# Patient Record
Sex: Female | Born: 1985 | Race: Black or African American | Hispanic: No | Marital: Single | Smoking: Never smoker
Health system: Southern US, Community
[De-identification: ages and names within clinical notes are randomized; demographics above are authoritative.]

## PROBLEM LIST (undated history)

## (undated) DIAGNOSIS — E079 Disorder of thyroid, unspecified: Secondary | ICD-10-CM

---

## 2009-11-01 ENCOUNTER — Emergency Department (HOSPITAL_COMMUNITY): Admission: EM | Admit: 2009-11-01 | Discharge: 2009-11-01 | Payer: Self-pay | Admitting: Emergency Medicine

## 2010-04-20 ENCOUNTER — Emergency Department (HOSPITAL_COMMUNITY): Admission: EM | Admit: 2010-04-20 | Discharge: 2010-04-20 | Payer: Self-pay | Admitting: Family Medicine

## 2010-10-03 LAB — POCT URINALYSIS DIPSTICK
Bilirubin Urine: NEGATIVE
Hgb urine dipstick: NEGATIVE
Ketones, ur: NEGATIVE mg/dL
Nitrite: NEGATIVE
Specific Gravity, Urine: 1.015 (ref 1.005–1.030)
pH: 7 (ref 5.0–8.0)

## 2011-04-08 ENCOUNTER — Inpatient Hospital Stay (INDEPENDENT_AMBULATORY_CARE_PROVIDER_SITE_OTHER)
Admission: RE | Admit: 2011-04-08 | Discharge: 2011-04-08 | Disposition: A | Discharge status: Home / Self Care | Source: Ambulatory Visit | Attending: Family Medicine | Admitting: Family Medicine

## 2011-04-08 DIAGNOSIS — A6000 Herpesviral infection of urogenital system, unspecified: Secondary | ICD-10-CM

## 2011-04-08 DIAGNOSIS — N390 Urinary tract infection, site not specified: Secondary | ICD-10-CM

## 2011-04-08 LAB — POCT URINALYSIS DIP (DEVICE)
Hgb urine dipstick: NEGATIVE
Ketones, ur: NEGATIVE mg/dL
Protein, ur: NEGATIVE mg/dL
Specific Gravity, Urine: 1.01 (ref 1.005–1.030)
pH: 6.5 (ref 5.0–8.0)

## 2011-04-08 LAB — WET PREP, GENITAL

## 2011-04-09 LAB — GC/CHLAMYDIA PROBE AMP, GENITAL
Chlamydia, DNA Probe: NEGATIVE
GC Probe Amp, Genital: NEGATIVE

## 2011-04-10 LAB — HERPES SIMPLEX VIRUS CULTURE

## 2012-02-06 ENCOUNTER — Emergency Department (HOSPITAL_COMMUNITY)

## 2012-02-06 ENCOUNTER — Encounter (HOSPITAL_COMMUNITY): Payer: Self-pay | Admitting: *Deleted

## 2012-02-06 ENCOUNTER — Observation Stay (HOSPITAL_COMMUNITY)
Admission: EM | Admit: 2012-02-06 | Discharge: 2012-02-09 | Disposition: A | Discharge status: Home / Self Care | Attending: Internal Medicine | Admitting: Internal Medicine

## 2012-02-06 DIAGNOSIS — Y998 Other external cause status: Secondary | ICD-10-CM | POA: Insufficient documentation

## 2012-02-06 DIAGNOSIS — IMO0002 Reserved for concepts with insufficient information to code with codable children: Secondary | ICD-10-CM | POA: Insufficient documentation

## 2012-02-06 DIAGNOSIS — R002 Palpitations: Secondary | ICD-10-CM | POA: Insufficient documentation

## 2012-02-06 DIAGNOSIS — S060X0A Concussion without loss of consciousness, initial encounter: Principal | ICD-10-CM | POA: Insufficient documentation

## 2012-02-06 DIAGNOSIS — R000 Tachycardia, unspecified: Secondary | ICD-10-CM | POA: Insufficient documentation

## 2012-02-06 DIAGNOSIS — D649 Anemia, unspecified: Secondary | ICD-10-CM | POA: Insufficient documentation

## 2012-02-06 DIAGNOSIS — R112 Nausea with vomiting, unspecified: Secondary | ICD-10-CM | POA: Insufficient documentation

## 2012-02-06 DIAGNOSIS — E0590 Thyrotoxicosis, unspecified without thyrotoxic crisis or storm: Secondary | ICD-10-CM | POA: Insufficient documentation

## 2012-02-06 DIAGNOSIS — S39012A Strain of muscle, fascia and tendon of lower back, initial encounter: Secondary | ICD-10-CM

## 2012-02-06 DIAGNOSIS — R55 Syncope and collapse: Secondary | ICD-10-CM | POA: Insufficient documentation

## 2012-02-06 LAB — PROTIME-INR
INR: 1.27 (ref 0.00–1.49)
Prothrombin Time: 16.2 seconds — ABNORMAL HIGH (ref 11.6–15.2)

## 2012-02-06 LAB — COMPREHENSIVE METABOLIC PANEL
AST: 33 U/L (ref 0–37)
BUN: 11 mg/dL (ref 6–23)
CO2: 23 mEq/L (ref 19–32)
Calcium: 9.2 mg/dL (ref 8.4–10.5)
Creatinine, Ser: 0.52 mg/dL (ref 0.50–1.10)
GFR calc non Af Amer: >90 mL/min (ref >90)

## 2012-02-06 LAB — CBC WITH DIFFERENTIAL/PLATELET
Basophils Absolute: 0 10*3/uL (ref 0.0–0.1)
Basophils Relative: 0 % (ref 0–1)
Eosinophils Relative: 1 % (ref 0–5)
HCT: 31.3 % — ABNORMAL LOW (ref 36.0–46.0)
Hemoglobin: 10.6 g/dL — ABNORMAL LOW (ref 12.0–15.0)
Lymphocytes Relative: 39 % (ref 12–46)
MCHC: 33.9 g/dL (ref 30.0–36.0)
MCV: 78.1 fL (ref 78.0–100.0)
Monocytes Absolute: 0.7 10*3/uL (ref 0.1–1.0)
Monocytes Relative: 18 % — ABNORMAL HIGH (ref 3–12)
RDW: 13.8 % (ref 11.5–15.5)

## 2012-02-06 LAB — APTT: aPTT: 34 seconds (ref 24–37)

## 2012-02-06 MED ORDER — SODIUM CHLORIDE 0.9 % IV BOLUS (SEPSIS)
1000.0000 mL | Freq: Once | INTRAVENOUS | Status: AC
  Administered 2012-02-06: 1000 mL via INTRAVENOUS

## 2012-02-06 MED ORDER — IBUPROFEN 600 MG PO TABS: 600.0000 mg | ORAL_TABLET | Freq: Four times a day (QID) | ORAL | Status: DC | PRN

## 2012-02-06 MED ORDER — MORPHINE SULFATE 4 MG/ML IJ SOLN
4.0000 mg | Freq: Once | INTRAMUSCULAR | Status: AC
  Administered 2012-02-06: 4 mg via INTRAVENOUS
  Filled 2012-02-06: qty 1

## 2012-02-06 MED ORDER — ONDANSETRON HCL 4 MG/2ML IJ SOLN
4.0000 mg | Freq: Once | INTRAMUSCULAR | Status: AC
  Administered 2012-02-06: 4 mg via INTRAVENOUS
  Filled 2012-02-06: qty 2

## 2012-02-06 MED ORDER — METHOCARBAMOL 500 MG PO TABS: 500.0000 mg | ORAL_TABLET | Freq: Two times a day (BID) | ORAL | Status: DC

## 2012-02-06 MED ORDER — HYDROCODONE-ACETAMINOPHEN 5-325 MG PO TABS: 2.0000 | ORAL_TABLET | ORAL | Status: DC | PRN
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
## 2012-02-07 ENCOUNTER — Encounter (HOSPITAL_COMMUNITY): Payer: Self-pay | Admitting: Internal Medicine

## 2012-02-07 ENCOUNTER — Emergency Department (HOSPITAL_COMMUNITY)

## 2012-02-07 ENCOUNTER — Observation Stay (HOSPITAL_COMMUNITY)

## 2012-02-07 DIAGNOSIS — R000 Tachycardia, unspecified: Secondary | ICD-10-CM

## 2012-02-07 DIAGNOSIS — D649 Anemia, unspecified: Secondary | ICD-10-CM | POA: Diagnosis present

## 2012-02-07 DIAGNOSIS — R55 Syncope and collapse: Secondary | ICD-10-CM

## 2012-02-07 DIAGNOSIS — R112 Nausea with vomiting, unspecified: Secondary | ICD-10-CM

## 2012-02-07 DIAGNOSIS — R002 Palpitations: Secondary | ICD-10-CM

## 2012-02-07 LAB — URINE MICROSCOPIC-ADD ON

## 2012-02-07 LAB — URINALYSIS, ROUTINE W REFLEX MICROSCOPIC
Bilirubin Urine: NEGATIVE
Glucose, UA: NEGATIVE mg/dL
Ketones, ur: NEGATIVE mg/dL
Protein, ur: NEGATIVE mg/dL
Urobilinogen, UA: 0.2 mg/dL (ref 0.0–1.0)

## 2012-02-07 LAB — CARDIAC PANEL(CRET KIN+CKTOT+MB+TROPI)
Relative Index: 2.3 (ref 0.0–2.5)
Troponin I: <0.3 ng/mL (ref <0.30)

## 2012-02-07 MED ORDER — ONDANSETRON HCL 4 MG/2ML IJ SOLN
4.0000 mg | Freq: Once | INTRAMUSCULAR | Status: AC
  Administered 2012-02-07: 4 mg via INTRAVENOUS
  Filled 2012-02-07: qty 2

## 2012-02-07 MED ORDER — SODIUM CHLORIDE 0.9 % IV SOLN
INTRAVENOUS | Status: DC
  Administered 2012-02-07 (×2): via INTRAVENOUS

## 2012-02-07 MED ORDER — ACETAMINOPHEN 650 MG RE SUPP: 650.0000 mg | Freq: Four times a day (QID) | RECTAL | Status: DC | PRN

## 2012-02-07 MED ORDER — MORPHINE SULFATE 2 MG/ML IJ SOLN: 2.0000 mg | INTRAMUSCULAR | Status: DC | PRN

## 2012-02-07 MED ORDER — DEXTROSE-NACL 5-0.9 % IV SOLN: INTRAVENOUS | Status: DC

## 2012-02-07 MED ORDER — ONDANSETRON HCL 4 MG PO TABS: 4.0000 mg | ORAL_TABLET | Freq: Four times a day (QID) | ORAL | Status: DC | PRN

## 2012-02-07 MED ORDER — METOPROLOL TARTRATE 12.5 MG HALF TABLET
12.5000 mg | ORAL_TABLET | Freq: Two times a day (BID) | ORAL | Status: DC
  Administered 2012-02-07 – 2012-02-09 (×3): 12.5 mg via ORAL
  Filled 2012-02-07 (×5): qty 1

## 2012-02-07 MED ORDER — IOHEXOL 300 MG/ML  SOLN
100.0000 mL | Freq: Once | INTRAMUSCULAR | Status: AC | PRN
  Administered 2012-02-07: 100 mL via INTRAVENOUS

## 2012-02-07 MED ORDER — ONDANSETRON HCL 4 MG/2ML IJ SOLN
4.0000 mg | Freq: Four times a day (QID) | INTRAMUSCULAR | Status: DC | PRN
  Administered 2012-02-07: 4 mg via INTRAVENOUS
  Filled 2012-02-07: qty 2

## 2012-02-07 MED ORDER — LORAZEPAM 2 MG/ML IJ SOLN
1.0000 mg | Freq: Once | INTRAMUSCULAR | Status: AC
  Administered 2012-02-07: 1 mg via INTRAVENOUS
  Filled 2012-02-07: qty 1

## 2012-02-07 MED ORDER — ACETAMINOPHEN 325 MG PO TABS: 650.0000 mg | ORAL_TABLET | Freq: Four times a day (QID) | ORAL | Status: DC | PRN

## 2012-02-07 MED ORDER — IOHEXOL 350 MG/ML SOLN
100.0000 mL | Freq: Once | INTRAVENOUS | Status: AC | PRN
  Administered 2012-02-07: 100 mL via INTRAVENOUS
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
## 2012-02-07 NOTE — ED Notes (Addendum)
[phone] (Home) - Father, Langston Masker

## 2012-02-07 NOTE — ED Notes (Signed)
Patient transported to Ultrasound 
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
## 2012-02-08 ENCOUNTER — Encounter (HOSPITAL_COMMUNITY): Payer: Self-pay | Admitting: *Deleted

## 2012-02-08 DIAGNOSIS — R000 Tachycardia, unspecified: Secondary | ICD-10-CM

## 2012-02-08 DIAGNOSIS — R55 Syncope and collapse: Secondary | ICD-10-CM

## 2012-02-08 DIAGNOSIS — I059 Rheumatic mitral valve disease, unspecified: Secondary | ICD-10-CM

## 2012-02-08 LAB — T3
T3, Total: 351.1 ng/dl — ABNORMAL HIGH (ref 80.0–204.0)
T3, Total: 316.9 ng/dl — ABNORMAL HIGH (ref 80.0–204.0)

## 2012-02-08 LAB — BASIC METABOLIC PANEL
CO2: 25 mEq/L (ref 19–32)
Calcium: 9.1 mg/dL (ref 8.4–10.5)
GFR calc Af Amer: >90 mL/min (ref >90)
GFR calc non Af Amer: >90 mL/min (ref >90)
Sodium: 142 mEq/L (ref 135–145)

## 2012-02-08 LAB — CBC
MCV: 79.4 fL (ref 78.0–100.0)
Platelets: 161 10*3/uL (ref 150–400)
RBC: 3.74 MIL/uL — ABNORMAL LOW (ref 3.87–5.11)
WBC: 2.6 10*3/uL — ABNORMAL LOW (ref 4.0–10.5)

## 2012-02-08 LAB — T4, FREE
Free T4: 4.34 ng/dL — ABNORMAL HIGH (ref 0.80–1.80)
Free T4: 4.38 ng/dL — ABNORMAL HIGH (ref 0.80–1.80)

## 2012-02-08 MED ORDER — METHIMAZOLE 10 MG PO TABS
10.0000 mg | ORAL_TABLET | Freq: Two times a day (BID) | ORAL | Status: DC
  Administered 2012-02-08 – 2012-02-09 (×2): 10 mg via ORAL
  Filled 2012-02-08 (×5): qty 1

## 2012-02-08 MED ORDER — POTASSIUM CHLORIDE CRYS ER 20 MEQ PO TBCR
40.0000 meq | EXTENDED_RELEASE_TABLET | Freq: Once | ORAL | Status: AC
  Administered 2012-02-08: 40 meq via ORAL
  Filled 2012-02-08: qty 2
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
## 2012-02-08 NOTE — Progress Notes (Signed)
  Echocardiogram 2D Echocardiogram has been performed.  [place] 02/08/2012, 12:58 PM

## 2012-02-09 DIAGNOSIS — E0590 Thyrotoxicosis, unspecified without thyrotoxic crisis or storm: Secondary | ICD-10-CM | POA: Diagnosis present

## 2012-02-09 DIAGNOSIS — R222 Localized swelling, mass and lump, trunk: Secondary | ICD-10-CM

## 2012-02-09 DIAGNOSIS — S060X0A Concussion without loss of consciousness, initial encounter: Principal | ICD-10-CM

## 2012-02-09 DIAGNOSIS — IMO0002 Reserved for concepts with insufficient information to code with codable children: Secondary | ICD-10-CM

## 2012-02-09 LAB — FERRITIN: Ferritin: 67 ng/mL (ref 10–291)

## 2012-02-09 LAB — RETICULOCYTES
RBC.: 3.99 MIL/uL (ref 3.87–5.11)
Retic Count, Absolute: 47.9 10*3/uL (ref 19.0–186.0)
Retic Ct Pct: 1.2 % (ref 0.4–3.1)

## 2012-02-09 LAB — T3 UPTAKE: T3 Uptake Ratio: 53.1 % — ABNORMAL HIGH (ref 22.5–37.0)

## 2012-02-09 MED ORDER — METHOCARBAMOL 500 MG PO TABS: 500.0000 mg | ORAL_TABLET | Freq: Three times a day (TID) | ORAL | Status: AC | PRN

## 2012-02-09 MED ORDER — METOPROLOL TARTRATE 12.5 MG HALF TABLET: 12.5000 mg | ORAL_TABLET | Freq: Two times a day (BID) | ORAL | Status: DC

## 2012-02-09 MED ORDER — PROMETHAZINE HCL 12.5 MG PO TABS: 12.5000 mg | ORAL_TABLET | Freq: Four times a day (QID) | ORAL | Status: DC | PRN

## 2012-02-09 MED ORDER — METHIMAZOLE 10 MG PO TABS: 10.0000 mg | ORAL_TABLET | Freq: Two times a day (BID) | ORAL | Status: DC
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
## 2012-02-09 NOTE — Progress Notes (Signed)
Patient discharged to home.  Discharge teaching completed including follow up appointments, and discharge meds. Verbalizes understanding with no further questions.

## 2012-02-10 LAB — URINE DRUGS OF ABUSE SCREEN W ALC, ROUTINE (REF LAB)
Amphetamine Screen, Ur: NEGATIVE
Barbiturate Quant, Ur: NEGATIVE
Benzodiazepines.: NEGATIVE
Creatinine,U: 50.2 mg/dL
Ethyl Alcohol: <10 mg/dL (ref <10)
Marijuana Metabolite: NEGATIVE
Opiate Screen, Urine: NEGATIVE

## 2012-04-17 ENCOUNTER — Emergency Department (HOSPITAL_COMMUNITY)
Admission: EM | Admit: 2012-04-17 | Discharge: 2012-04-17 | Disposition: A | Discharge status: Home / Self Care | Attending: Emergency Medicine | Admitting: Emergency Medicine

## 2012-04-17 ENCOUNTER — Encounter (HOSPITAL_COMMUNITY): Payer: Self-pay | Admitting: Emergency Medicine

## 2012-04-17 ENCOUNTER — Emergency Department (HOSPITAL_COMMUNITY)

## 2012-04-17 DIAGNOSIS — S61059A Open bite of unspecified thumb without damage to nail, initial encounter: Secondary | ICD-10-CM

## 2012-04-17 DIAGNOSIS — W540XXA Bitten by dog, initial encounter: Secondary | ICD-10-CM | POA: Insufficient documentation

## 2012-04-17 DIAGNOSIS — S61209A Unspecified open wound of unspecified finger without damage to nail, initial encounter: Secondary | ICD-10-CM | POA: Insufficient documentation

## 2012-04-17 DIAGNOSIS — Z91013 Allergy to seafood: Secondary | ICD-10-CM | POA: Insufficient documentation

## 2012-04-17 HISTORY — DX: Disorder of thyroid, unspecified: E07.9

## 2012-04-17 MED ORDER — TRAMADOL HCL 50 MG PO TABS
100.0000 mg | ORAL_TABLET | Freq: Once | ORAL | Status: AC
  Administered 2012-04-17: 100 mg via ORAL
  Filled 2012-04-17: qty 2

## 2012-04-17 MED ORDER — AMOXICILLIN-POT CLAVULANATE 875-125 MG PO TABS: 1.0000 | ORAL_TABLET | Freq: Two times a day (BID) | ORAL | Status: DC

## 2012-04-17 MED ORDER — TRAMADOL-ACETAMINOPHEN 37.5-325 MG PO TABS: ORAL_TABLET | ORAL | Status: DC

## 2012-04-17 MED ORDER — ACETAMINOPHEN 325 MG PO TABS
650.0000 mg | ORAL_TABLET | Freq: Once | ORAL | Status: AC
  Administered 2012-04-17: 650 mg via ORAL
  Filled 2012-04-17: qty 2

## 2012-04-17 NOTE — ED Notes (Signed)
Pt reports that her dog bit her yesterday morning on her left thumb. Bleeding controlled. Pt reports dog is up to date on all shots.
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
## 2012-04-17 NOTE — ED Notes (Signed)
Patient has noted cracked nail as well.  She is attempting to remove the polish for better exam

## 2012-07-13 ENCOUNTER — Other Ambulatory Visit: Payer: Self-pay | Admitting: *Deleted

## 2012-07-13 DIAGNOSIS — J9859 Other diseases of mediastinum, not elsewhere classified: Secondary | ICD-10-CM

## 2012-08-10 ENCOUNTER — Encounter: Admitting: Thoracic Surgery (Cardiothoracic Vascular Surgery)

## 2012-08-10 ENCOUNTER — Other Ambulatory Visit

## 2012-08-31 ENCOUNTER — Inpatient Hospital Stay: Admission: RE | Admit: 2012-08-31 | Source: Ambulatory Visit

## 2012-08-31 ENCOUNTER — Encounter: Admitting: Thoracic Surgery (Cardiothoracic Vascular Surgery)

## 2013-05-09 ENCOUNTER — Other Ambulatory Visit (HOSPITAL_COMMUNITY)
Admission: RE | Admit: 2013-05-09 | Discharge: 2013-05-09 | Disposition: A | Discharge status: Home / Self Care | Source: Ambulatory Visit | Attending: Family Medicine | Admitting: Family Medicine

## 2013-05-09 ENCOUNTER — Emergency Department (HOSPITAL_COMMUNITY)
Admission: EM | Admit: 2013-05-09 | Discharge: 2013-05-09 | Disposition: A | Discharge status: Home / Self Care | Source: Home / Self Care | Attending: Family Medicine | Admitting: Family Medicine

## 2013-05-09 ENCOUNTER — Encounter (HOSPITAL_COMMUNITY): Payer: Self-pay | Admitting: Emergency Medicine

## 2013-05-09 DIAGNOSIS — N760 Acute vaginitis: Secondary | ICD-10-CM | POA: Insufficient documentation

## 2013-05-09 DIAGNOSIS — N72 Inflammatory disease of cervix uteri: Secondary | ICD-10-CM

## 2013-05-09 DIAGNOSIS — Z113 Encounter for screening for infections with a predominantly sexual mode of transmission: Secondary | ICD-10-CM | POA: Insufficient documentation

## 2013-05-09 LAB — POCT URINALYSIS DIP (DEVICE)
Bilirubin Urine: NEGATIVE
Ketones, ur: NEGATIVE mg/dL
Leukocytes, UA: NEGATIVE
Protein, ur: NEGATIVE mg/dL

## 2013-05-09 MED ORDER — LIDOCAINE HCL (PF) 1 % IJ SOLN
INTRAMUSCULAR | Status: AC
  Filled 2013-05-09: qty 5

## 2013-05-09 MED ORDER — CEFTRIAXONE SODIUM 250 MG IJ SOLR
INTRAMUSCULAR | Status: AC
  Filled 2013-05-09: qty 250

## 2013-05-09 MED ORDER — CEFTRIAXONE SODIUM 250 MG IJ SOLR
250.0000 mg | Freq: Once | INTRAMUSCULAR | Status: AC
  Administered 2013-05-09: 250 mg via INTRAMUSCULAR

## 2013-05-09 MED ORDER — AZITHROMYCIN 250 MG PO TABS
1000.0000 mg | ORAL_TABLET | Freq: Once | ORAL | Status: AC
  Administered 2013-05-09: 1000 mg via ORAL

## 2013-05-09 MED ORDER — FLUCONAZOLE 150 MG PO TABS: 150.0000 mg | ORAL_TABLET | Freq: Once | ORAL | Status: DC

## 2013-05-09 MED ORDER — AZITHROMYCIN 250 MG PO TABS
ORAL_TABLET | ORAL | Status: AC
  Filled 2013-05-09: qty 4

## 2013-05-09 NOTE — ED Notes (Signed)
Concern for STD; had similar syx of vaginal dc when she had chlamydia; partner has NOT informed her of issues

## 2013-05-09 NOTE — ED Notes (Signed)
Call back number for lab issues verified 

## 2013-05-09 NOTE — ED Provider Notes (Signed)
CSN: [id number]     Arrival date & time 05/09/13  1635 History   First MD Initiated Contact with Patient 05/09/13 1852     No chief complaint on file.  (Consider location/radiation/quality/duration/timing/severity/associated sxs/prior Treatment) Patient is a 27 y.o. female presenting with vaginal discharge. The history is provided by the patient. No language interpreter was used.  Vaginal Discharge Quality:  Watery Severity:  Moderate Onset quality:  Gradual Timing:  Constant Progression:  Worsening Chronicity:  New Relieved by:  Nothing Worsened by:  Nothing tried Ineffective treatments:  None tried   Past Medical History  Diagnosis Date  . Thyroid disease    Past Surgical History  Procedure Laterality Date  . Cesarean section     No family history on file. History  Substance Use Topics  . Smoking status: Never Smoker   . Smokeless tobacco: Not on file  . Alcohol Use: Yes   OB History   Grav Para Term Preterm Abortions TAB SAB Ect Mult Living                 Review of Systems  Genitourinary: Positive for vaginal discharge.  All other systems reviewed and are negative.    Allergies  Shellfish allergy  Home Medications   Current Outpatient Rx  Name  Route  Sig  Dispense  Refill  . amoxicillin-clavulanate (AUGMENTIN) 875-125 MG per tablet   Oral   Take 1 tablet by mouth every 12 (twelve) hours.   14 tablet   0   . EXPIRED: promethazine (PHENERGAN) 12.5 MG tablet   Oral   Take 1 tablet (12.5 mg total) by mouth every 6 (six) hours as needed for nausea.   30 tablet   0   . traMADol-acetaminophen (ULTRACET) 37.5-325 MG per tablet      2 tabs po QID prn pain   16 tablet   0    There were no vitals taken for this visit. Physical Exam  Nursing note and vitals reviewed. Constitutional: She is oriented to person, place, and time. She appears well-developed and well-nourished.  HENT:  Head: Normocephalic and atraumatic.  Eyes: Conjunctivae and EOM are  normal. Pupils are equal, round, and reactive to light.  Neck: Normal range of motion.  Cardiovascular: Normal rate and normal heart sounds.   Pulmonary/Chest: Effort normal and breath sounds normal.  Abdominal: Soft.  Genitourinary: Vaginal discharge found.  Neurological: She is alert and oriented to person, place, and time. She has normal reflexes.  Skin: Skin is warm.  Psychiatric: She has a normal mood and affect.    ED Course  Procedures (including critical care time) Labs Review Labs Reviewed - No data to display Imaging Review No results found.  EKG Interpretation     Ventricular Rate:    PR Interval:    QRS Duration:   QT Interval:    QTC Calculation:   R Axis:     Text Interpretation:              MDM   1. Cervicitis    Pt given rocephin, zithromax and diflucan.      Elson Areas, PA-C 05/09/13 [phone]

## 2013-05-11 NOTE — ED Provider Notes (Signed)
Medical screening examination/treatment/procedure(s) were performed by resident physician or non-physician practitioner and as supervising physician I was immediately available for consultation/collaboration.   KINDL,JAMES DOUGLAS MD.   James D Kindl, MD 05/11/13 2101 

## 2013-05-12 ENCOUNTER — Telehealth (HOSPITAL_COMMUNITY): Payer: Self-pay | Admitting: *Deleted

## 2013-05-12 MED ORDER — METRONIDAZOLE 500 MG PO TABS: 500.0000 mg | ORAL_TABLET | Freq: Two times a day (BID) | ORAL | Status: DC

## 2013-05-12 NOTE — ED Notes (Addendum)
10/22  GC/Chlamydia neg. Affirm: Candida and Trich neg., Gardnerella pos. Langston Masker PA notified and e-prescribed Flagyl to pt.'s pharmacy.  10/23   I called pt. Pt. verified x 2 and given results.  Pt. told she needs Flagyl for bacterial vaginosis and where to pick up her Rx.   Pt. instructed to no alcohol while taking this medication.  Pt. voiced understanding. [name] 05/12/2013

## 2013-08-01 ENCOUNTER — Other Ambulatory Visit (HOSPITAL_COMMUNITY)
Admission: RE | Admit: 2013-08-01 | Discharge: 2013-08-01 | Disposition: A | Discharge status: Home / Self Care | Source: Ambulatory Visit | Attending: Emergency Medicine | Admitting: Emergency Medicine

## 2013-08-01 ENCOUNTER — Encounter (HOSPITAL_COMMUNITY): Payer: Self-pay | Admitting: Emergency Medicine

## 2013-08-01 ENCOUNTER — Emergency Department (INDEPENDENT_AMBULATORY_CARE_PROVIDER_SITE_OTHER)
Admission: EM | Admit: 2013-08-01 | Discharge: 2013-08-01 | Disposition: A | Discharge status: Home / Self Care | Source: Home / Self Care | Attending: Emergency Medicine | Admitting: Emergency Medicine

## 2013-08-01 DIAGNOSIS — Z113 Encounter for screening for infections with a predominantly sexual mode of transmission: Secondary | ICD-10-CM | POA: Insufficient documentation

## 2013-08-01 DIAGNOSIS — N760 Acute vaginitis: Secondary | ICD-10-CM | POA: Insufficient documentation

## 2013-08-01 DIAGNOSIS — N898 Other specified noninflammatory disorders of vagina: Secondary | ICD-10-CM

## 2013-08-01 LAB — POCT URINALYSIS DIP (DEVICE)
Bilirubin Urine: NEGATIVE
GLUCOSE, UA: NEGATIVE mg/dL
Ketones, ur: NEGATIVE mg/dL
Leukocytes, UA: NEGATIVE
NITRITE: NEGATIVE
Protein, ur: NEGATIVE mg/dL
Specific Gravity, Urine: 1.025 (ref 1.005–1.030)
Urobilinogen, UA: 0.2 mg/dL (ref 0.0–1.0)
pH: 7 (ref 5.0–8.0)

## 2013-08-01 LAB — POCT PREGNANCY, URINE: Preg Test, Ur: NEGATIVE

## 2013-08-01 NOTE — Discharge Instructions (Signed)
You will be contacted with the results of your swabs and treatment will be based upon the results.

## 2013-08-01 NOTE — ED Notes (Signed)
C/o vaginal discharge with odor.  Mild pelvic pain.  Denies fever and any other symptoms.  Onset 1/9.  No otc meds used.

## 2013-08-01 NOTE — ED Provider Notes (Signed)
Medical screening examination/treatment/procedure(s) were performed by non-physician practitioner and as supervising physician I was immediately available for consultation/collaboration.  Ladasha Schnackenberg, M.D.  Latoyia Tecson C London Tarnowski, MD 08/01/13 2319 

## 2013-08-01 NOTE — ED Provider Notes (Signed)
CSN: [id number]     Arrival date & time 08/01/13  [id number] History   First MD Initiated Contact with Patient 08/01/13 [phone]     Chief Complaint  Patient presents with  . Vaginal Discharge   (Consider location/radiation/quality/duration/timing/severity/associated sxs/prior Treatment) HPI Comments: LNMP 07/14/2013  Patient is a 28 y.o. female presenting with vaginal discharge. The history is provided by the patient.  Vaginal Discharge Quality:  Malodorous Severity:  Moderate Onset quality:  Gradual Duration:  4 days Progression:  Unchanged Relieved by:  None tried Associated symptoms: no abdominal pain, no dyspareunia, no dysuria, no fever, no genital lesions, no nausea, no rash, no urinary frequency, no vaginal itching and no vomiting     Past Medical History  Diagnosis Date  . Thyroid disease    Past Surgical History  Procedure Laterality Date  . Cesarean section     History reviewed. No pertinent family history. History  Substance Use Topics  . Smoking status: Never Smoker   . Smokeless tobacco: Not on file  . Alcohol Use: Yes   OB History   Grav Para Term Preterm Abortions TAB SAB Ect Mult Living                 Review of Systems  Constitutional: Negative for fever.  Gastrointestinal: Negative for nausea, vomiting and abdominal pain.  Genitourinary: Positive for vaginal discharge. Negative for dysuria and dyspareunia.  All other systems reviewed and are negative.    Allergies  Shellfish allergy  Home Medications   Current Outpatient Rx  Name  Route  Sig  Dispense  Refill  . amoxicillin-clavulanate (AUGMENTIN) 875-125 MG per tablet   Oral   Take 1 tablet by mouth every 12 (twelve) hours.   14 tablet   0   . fluconazole (DIFLUCAN) 150 MG tablet   Oral   Take 1 tablet (150 mg total) by mouth once.   1 tablet   1   . metroNIDAZOLE (FLAGYL) 500 MG tablet   Oral   Take 1 tablet (500 mg total) by mouth 2 (two) times daily.   14 tablet   0   . EXPIRED:  promethazine (PHENERGAN) 12.5 MG tablet   Oral   Take 1 tablet (12.5 mg total) by mouth every 6 (six) hours as needed for nausea.   30 tablet   0   . traMADol-acetaminophen (ULTRACET) 37.5-325 MG per tablet      2 tabs po QID prn pain   16 tablet   0    BP 130/85  Pulse 112  Temp(Src) 99.8 F (37.7 C) (Oral)  Resp 20  SpO2 100%  LMP 07/14/2013 Physical Exam  Nursing note and vitals reviewed. Constitutional: She is oriented to person, place, and time. She appears well-developed and well-nourished. No distress.  Eyes: Conjunctivae are normal.  Neck: Normal range of motion.  Cardiovascular: Normal rate.   Pulmonary/Chest: Effort normal.  Abdominal: Soft. Bowel sounds are normal. She exhibits no distension. There is no tenderness. Hernia confirmed negative in the right inguinal area and confirmed negative in the left inguinal area.  Genitourinary: Uterus normal. Pelvic exam was performed with patient supine. There is no rash, tenderness or lesion on the right labia. There is no rash, tenderness or lesion on the left labia. Cervix exhibits no motion tenderness, no discharge and no friability. Right adnexum displays no mass, no tenderness and no fullness. Left adnexum displays no mass, no tenderness and no fullness. No erythema, tenderness or bleeding around the vagina. No foreign  body around the vagina. No signs of injury around the vagina. No vaginal discharge found.  Neurological: She is alert and oriented to person, place, and time.  Skin: Skin is warm and dry. No rash noted.  Psychiatric: She has a normal mood and affect. Her behavior is normal.    ED Course  Procedures (including critical care time) Labs Review Labs Reviewed  CERVICOVAGINAL ANCILLARY ONLY   Imaging Review No results found.  EKG Interpretation    Date/Time:    Ventricular Rate:    PR Interval:    QRS Duration:   QT Interval:    QTC Calculation:   R Axis:     Text Interpretation:               MDM  28 y/o F with hx of BV 2-3 months ago here with concerns that she has developed BV again. Counseled patient that she may wish to re-establish care with her previous OB/GYN provider not only if vaginal discharge becomes a recurrent issue, but for annual GYN health maintenance visits as well. Treatment to be based upon results of pelvic swabs. Exam unremarkable.     Jess Barters [place], [place] 08/01/13 [phone]

## 2013-08-03 ENCOUNTER — Telehealth (HOSPITAL_COMMUNITY): Payer: Self-pay | Admitting: Physician Assistant

## 2013-08-03 MED ORDER — METRONIDAZOLE 500 MG PO TABS: 500.0000 mg | ORAL_TABLET | Freq: Two times a day (BID) | ORAL | Status: DC

## 2013-08-03 NOTE — Telephone Encounter (Signed)
Patient stated at time of visit if swab consistent with BV, she would like to be treated. Results reviewed and consistent with BV. Will E-prescribe metronidazole 500 mg po BID x 7 days and advise GYN follow up if no improvement.

## 2013-08-04 ENCOUNTER — Telehealth (HOSPITAL_COMMUNITY): Payer: Self-pay | Admitting: *Deleted

## 2013-08-04 NOTE — ED Notes (Signed)
GC/Chlamydia neg., Affirm: Candida and Trich neg., Gardnerella pos. 1/14 Narda BondsLee Presson PA notified and she e-prescribed Flagyl.  1/15  I called pt. Pt. verified x 2 and given results.  Pt. told she needs Flagyl for bacterial vaginosis.   Pt. instructed to no alcohol while taking this medication. Pt. told where to pick up her Rx. Pt. voiced understanding. Vassie MoselleYork, Daden Mahany M 08/04/2013

## 2013-10-10 ENCOUNTER — Emergency Department (INDEPENDENT_AMBULATORY_CARE_PROVIDER_SITE_OTHER)
Admission: EM | Admit: 2013-10-10 | Discharge: 2013-10-10 | Disposition: A | Discharge status: Home / Self Care | Source: Home / Self Care | Attending: Family Medicine | Admitting: Family Medicine

## 2013-10-10 ENCOUNTER — Encounter (HOSPITAL_COMMUNITY): Payer: Self-pay | Admitting: Emergency Medicine

## 2013-10-10 DIAGNOSIS — N63 Unspecified lump in unspecified breast: Secondary | ICD-10-CM

## 2013-10-10 NOTE — ED Provider Notes (Signed)
Peggy Doyle is a 28 y.o. female who presents to Urgent Care today for right breast mass. Present in the 8:00 position of her right breast for the last 2 days. It is somewhat tender and mobile. No nipple discharge. No fevers chills nausea vomiting or diarrhea. Patient has a history of fibrocystic change of the right breast when she was 28 years old. No significant family history for breast cancer. Last menstrual period ended March 19.   Past Medical History  Diagnosis Date  . Thyroid disease    History  Substance Use Topics  . Smoking status: Never Smoker   . Smokeless tobacco: Not on file  . Alcohol Use: Yes   ROS as above Medications: No current facility-administered medications for this encounter.   Current Outpatient Prescriptions  Medication Sig Dispense Refill  . amoxicillin-clavulanate (AUGMENTIN) 875-125 MG per tablet Take 1 tablet by mouth every 12 (twelve) hours.  14 tablet  0  . fluconazole (DIFLUCAN) 150 MG tablet Take 1 tablet (150 mg total) by mouth once.  1 tablet  1  . metroNIDAZOLE (FLAGYL) 500 MG tablet Take 1 tablet (500 mg total) by mouth 2 (two) times daily.  14 tablet  0  . metroNIDAZOLE (FLAGYL) 500 MG tablet Take 1 tablet (500 mg total) by mouth 2 (two) times daily. X 7 days  14 tablet  0  . promethazine (PHENERGAN) 12.5 MG tablet Take 1 tablet (12.5 mg total) by mouth every 6 (six) hours as needed for nausea.  30 tablet  0  . traMADol-acetaminophen (ULTRACET) 37.5-325 MG per tablet 2 tabs po QID prn pain  16 tablet  0    Exam:  BP 128/85  Pulse 78  Temp(Src) 98.9 F (37.2 C) (Oral)  Resp 16  SpO2 98%  LMP 10/01/2013 Gen: Well NAD BREASTS:  Right breast: Pierced nipple  otherwise normal-appearing. No skin changes. Small 1 cm mildly tender mobile mass in the 8:00 position consistent with fibrocystic change.  Left breast: Pierced nipple  normal-appearing no masses palpated nontender  Assessment and Plan: [age] y.o. female with right breast mass. Likely  fibrocystic change. Diagnostic mammogram and ultrasound ordered. Followup with primary care provider  Discussed warning signs or symptoms. Please see discharge instructions. Patient expresses understanding.    Rodolph BongEvan S Adonis Yim, MD 10/10/13 [phone]

## 2013-10-10 NOTE — ED Notes (Signed)
2 day history of pain in right breast. Denies trauma. States she had something similar many years ago, and had a mammogram that showed some type of a fibrous cyst. LMP ended 3-19, NAD

## 2013-10-10 NOTE — Discharge Instructions (Signed)
Thank you for coming in today. [facility] should call you soon. Call [phone] if you do not hear from them.  If you have trouble scheduling call my clinic and we will fix it.  Come back as needed.  Try ibuprofen for symptomatic relief.   Fibrocystic Breast Changes Fibrocystic breast changes occur when breast ducts become blocked, causing painful, fluid-filled lumps (cysts) to form in the breast. This is a common condition that is noncancerous (benign). It occurs when women go through hormonal changes during their menstrual cycle. Fibrocystic breast changes can affect one or both breasts. CAUSES  The exact cause of fibrocystic breast changes is not known, but it may be related to the female hormones, estrogen and progesterone. Family traits that get passed from parent to child (genetics) may also be a factor in some cases. SIGNS AND SYMPTOMS   Tenderness, mild discomfort, or pain.   Swelling.   Ropelike feeling when touching the breast.   Lumpy breast, one or both sides.   Changes in breast size, especially before (larger) and after (smaller) the menstrual period.   Green or dark brown nipple discharge (not blood).  Symptoms are usually worse before menstrual periods start and get better toward the end of the menstrual period.  DIAGNOSIS  To make a diagnosis, your health care provider will ask you questions and perform a physical exam of your breasts. The health care provider may recommend other tests that can examine inside your breasts, such as:  A breast X-ray (mammogram).   Ultrasonography.  An MRI.  If something more than fibrocystic breast changes is suspected, your health care provider may take a breast tissue sample (breast biopsy) to examine. TREATMENT  Often, treatment is not needed. Your health care provider may recommend over-the-counter pain relievers to help lessen pain or discomfort caused by the fibrocystic breast changes. You may  also be asked to change your diet to limit or stop eating foods or drinking beverages that contain caffeine. Foods and beverages that contain caffeine include chocolate, soda, coffee, and tea. Reducing sugar and fat in your diet may also help. Your health care provider may also recommend:  Fine needle aspiration to remove fluid from a cyst that is causing pain.   Surgery to remove a large, persistent, and tender cyst. HOME CARE INSTRUCTIONS   Examine your breasts after every menstrual period. If you do not have menstrual periods, check your breasts the first day of every month. Feel for changes, such as more tenderness, a new growth, a change in breast size, or a change in a lump that has always been there.   Only take over-the-counter or prescription medicine as directed by your health care provider.   Wear a well-fitted support or sports bra, especially when exercising.   Decrease or avoid caffeine, fat, and sugar in your diet as directed by your health care provider.  SEEK MEDICAL CARE IF:   You have fluid leaking (discharge) from your nipples, especially bloody discharge.   You have new lumps or bumps in the breast.   Your breast or breasts become enlarged, red, and painful.   You have areas of your breast that pucker in.   Your nipples appear flat or indented.  Document Released: 04/23/2006 Document Revised: 03/09/2013 Document Reviewed: 12/26/2012 [facility] Patient Information 2014 [place] WheelerExitCare, [place].

## 2013-10-11 ENCOUNTER — Other Ambulatory Visit (HOSPITAL_COMMUNITY): Payer: Self-pay | Admitting: Family Medicine

## 2013-10-11 DIAGNOSIS — N6313 Unspecified lump in the right breast, lower outer quadrant: Secondary | ICD-10-CM

## 2013-10-14 ENCOUNTER — Other Ambulatory Visit (HOSPITAL_COMMUNITY): Payer: Self-pay | Admitting: Family Medicine

## 2013-10-14 ENCOUNTER — Ambulatory Visit
Admission: RE | Admit: 2013-10-14 | Discharge: 2013-10-14 | Disposition: A | Discharge status: Home / Self Care | Source: Ambulatory Visit | Attending: Family Medicine | Admitting: Family Medicine

## 2013-10-14 DIAGNOSIS — N644 Mastodynia: Secondary | ICD-10-CM

## 2013-10-14 DIAGNOSIS — N6313 Unspecified lump in the right breast, lower outer quadrant: Secondary | ICD-10-CM

## 2013-11-07 ENCOUNTER — Encounter (HOSPITAL_COMMUNITY): Payer: Self-pay | Admitting: Emergency Medicine

## 2013-11-07 ENCOUNTER — Other Ambulatory Visit (HOSPITAL_COMMUNITY)
Admission: RE | Admit: 2013-11-07 | Discharge: 2013-11-07 | Disposition: A | Discharge status: Home / Self Care | Source: Ambulatory Visit | Attending: Family Medicine | Admitting: Family Medicine

## 2013-11-07 ENCOUNTER — Emergency Department (INDEPENDENT_AMBULATORY_CARE_PROVIDER_SITE_OTHER)
Admission: EM | Admit: 2013-11-07 | Discharge: 2013-11-07 | Disposition: A | Discharge status: Home / Self Care | Source: Home / Self Care | Attending: Family Medicine | Admitting: Family Medicine

## 2013-11-07 DIAGNOSIS — N760 Acute vaginitis: Secondary | ICD-10-CM | POA: Insufficient documentation

## 2013-11-07 DIAGNOSIS — IMO0002 Reserved for concepts with insufficient information to code with codable children: Secondary | ICD-10-CM

## 2013-11-07 DIAGNOSIS — Z113 Encounter for screening for infections with a predominantly sexual mode of transmission: Secondary | ICD-10-CM | POA: Insufficient documentation

## 2013-11-07 DIAGNOSIS — S39011A Strain of muscle, fascia and tendon of abdomen, initial encounter: Secondary | ICD-10-CM

## 2013-11-07 LAB — POCT URINALYSIS DIP (DEVICE)
Bilirubin Urine: NEGATIVE
GLUCOSE, UA: NEGATIVE mg/dL
Hgb urine dipstick: NEGATIVE
Ketones, ur: NEGATIVE mg/dL
LEUKOCYTES UA: NEGATIVE
Nitrite: NEGATIVE
Protein, ur: NEGATIVE mg/dL
Specific Gravity, Urine: 1.02 (ref 1.005–1.030)
Urobilinogen, UA: 0.2 mg/dL (ref 0.0–1.0)
pH: 7 (ref 5.0–8.0)

## 2013-11-07 LAB — POCT PREGNANCY, URINE: Preg Test, Ur: NEGATIVE

## 2013-11-07 MED ORDER — METRONIDAZOLE 500 MG PO TABS: 500.0000 mg | ORAL_TABLET | Freq: Two times a day (BID) | ORAL | Status: DC

## 2013-11-07 MED ORDER — DICLOFENAC SODIUM 50 MG PO TBEC: 50.0000 mg | DELAYED_RELEASE_TABLET | Freq: Two times a day (BID) | ORAL | Status: DC | PRN

## 2013-11-07 NOTE — Discharge Instructions (Signed)
Thank you for coming in today. Take Flagyl twice daily for vaginal discharge. Use diclofenac twice daily for abdominal wall pain. Avoid sit-ups and crunches for a week  Vaginitis Vaginitis is an inflammation of the vagina. It is most often caused by a change in the normal balance of the bacteria and yeast that live in the vagina. This change in balance causes an overgrowth of certain bacteria or yeast, which causes the inflammation. There are different types of vaginitis, but the most common types are:  Bacterial vaginosis.  Yeast infection (candidiasis).  Trichomoniasis vaginitis. This is a sexually transmitted infection (STI).  Viral vaginitis.  Atropic vaginitis.  Allergic vaginitis. CAUSES  The cause depends on the type of vaginitis. Vaginitis can be caused by:  Bacteria (bacterial vaginosis).  Yeast (yeast infection).  A parasite (trichomoniasis vaginitis)  A virus (viral vaginitis).  Low hormone levels (atrophic vaginitis). Low hormone levels can occur during pregnancy, breastfeeding, or after menopause.  Irritants, such as bubble baths, scented tampons, and feminine sprays (allergic vaginitis). Other factors can change the normal balance of the yeast and bacteria that live in the vagina. These include:  Antibiotic medicines.  Poor hygiene.  Diaphragms, vaginal sponges, spermicides, birth control pills, and intrauterine devices (IUD).  Sexual intercourse.  Infection.  Uncontrolled diabetes.  A weakened immune system. SYMPTOMS  Symptoms can vary depending on the cause of the vaginitis. Common symptoms include:  Abnormal vaginal discharge.  The discharge is white, gray, or yellow with bacterial vaginosis.  The discharge is thick, white, and cheesy with a yeast infection.  The discharge is frothy and yellow or greenish with trichomoniasis.  A bad vaginal odor.  The odor is fishy with bacterial vaginosis.  Vaginal itching, pain, or swelling.  Painful  intercourse.  Pain or burning when urinating. Sometimes, there are no symptoms. TREATMENT  Treatment will vary depending on the type of infection.   Bacterial vaginosis and trichomoniasis are often treated with antibiotic creams or pills.  Yeast infections are often treated with antifungal medicines, such as vaginal creams or suppositories.  Viral vaginitis has no cure, but symptoms can be treated with medicines that relieve discomfort. Your sexual partner should be treated as well.  Atrophic vaginitis may be treated with an estrogen cream, pill, suppository, or vaginal ring. If vaginal dryness occurs, lubricants and moisturizing creams may help. You may be told to avoid scented soaps, sprays, or douches.  Allergic vaginitis treatment involves quitting the use of the product that is causing the problem. Vaginal creams can be used to treat the symptoms. HOME CARE INSTRUCTIONS   Take all medicines as directed by your caregiver.  Keep your genital area clean and dry. Avoid soap and only rinse the area with water.  Avoid douching. It can remove the healthy bacteria in the vagina.  Do not use tampons or have sexual intercourse until your vaginitis has been treated. Use sanitary pads while you have vaginitis.  Wipe from front to back. This avoids the spread of bacteria from the rectum to the vagina.  Let air reach your genital area.  Wear cotton underwear to decrease moisture buildup.  Avoid wearing underwear while you sleep until your vaginitis is gone.  Avoid tight pants and underwear or nylons without a cotton panel.  Take off wet clothing (especially bathing suits) as soon as possible.  Use mild, non-scented products. Avoid using irritants, such as:  Scented feminine sprays.  Fabric softeners.  Scented detergents.  Scented tampons.  Scented soaps or bubble baths.  Practice safe sex and use condoms. Condoms may prevent the spread of trichomoniasis and viral  vaginitis. SEEK MEDICAL CARE IF:   You have abdominal pain.  You have a fever or persistent symptoms for more than 2 3 days.  You have a fever and your symptoms suddenly get worse. Document Released: 05/04/2007 Document Revised: 03/31/2012 Document Reviewed: 12/18/2011 [facility] Patient Information 2014 [place]ExitCare, [place].

## 2013-11-07 NOTE — ED Notes (Signed)
Reports abdominal pain and vaginal discharge.  Onset 4/15.  Vaginal discharge started one day after onset of pain

## 2013-11-07 NOTE — ED Provider Notes (Signed)
Peggy Doyle is a 28 y.o. female who presents to Urgent Care today for right abdomen pain and vaginal discharge.  1) abdominal pain. Patient is right sided abdominal pain for one week. She notes the pain after increasing her activity and starting an abdominal muscle workout. The pain is worse with abdominal motion. She denies any urinary frequency urgency or dysuria. She denies any fevers or chills nausea vomiting or diarrhea. She has vaginal discharge as well. She has not tried any medications for her wall pain. She feels well otherwise.  2) vaginal discharge present for about one week. No treatment provided. Consistent with prior episodes of bacterial vaginosis.   Past Medical History  Diagnosis Date  . Thyroid disease    History  Substance Use Topics  . Smoking status: Never Smoker   . Smokeless tobacco: Not on file  . Alcohol Use: Yes   ROS as above Medications: No current facility-administered medications for this encounter.   Current Outpatient Prescriptions  Medication Sig Dispense Refill  . diclofenac (VOLTAREN) 50 MG EC tablet Take 1 tablet (50 mg total) by mouth 2 (two) times daily as needed.  60 tablet  0  . metroNIDAZOLE (FLAGYL) 500 MG tablet Take 1 tablet (500 mg total) by mouth 2 (two) times daily.  14 tablet  0  . [DISCONTINUED] promethazine (PHENERGAN) 12.5 MG tablet Take 1 tablet (12.5 mg total) by mouth every 6 (six) hours as needed for nausea.  30 tablet  0    Exam:  BP 137/76  Pulse 80  Temp(Src) 98 F (36.7 C) (Oral)  Resp 16  SpO2 98%  LMP 10/01/2013 Gen: Well NAD HEENT: EOMI,  MMM Lungs: Normal work of breathing. CTABL Heart: RRR no MRG Abd: NABS, Soft. Non-distended. Mildly tender palpation upper left lateral quadrant. Pain is worse with abdominal wall flexion. Negative Murphy sign. No CV angle tenderness to percussion Exts: Brisk capillary refill, warm and well perfused.  GYN: Normal external genitalia. Vaginal canal with thin white discharge.  Normal-appearing cervix. No cervical motion tenderness or adnexal mass.   Results for orders placed during the hospital encounter of 11/07/13 (from the past 24 hour(s))  POCT URINALYSIS DIP (DEVICE)     Status: None   Collection Time    11/07/13  9:35 AM      Result Value Ref Range   Glucose, UA NEGATIVE  NEGATIVE mg/dL   Bilirubin Urine NEGATIVE  NEGATIVE   Ketones, ur NEGATIVE  NEGATIVE mg/dL   Specific Gravity, Urine 1.020  1.005 - 1.030   Hgb urine dipstick NEGATIVE  NEGATIVE   pH 7.0  5.0 - 8.0   Protein, ur NEGATIVE  NEGATIVE mg/dL   Urobilinogen, UA 0.2  0.0 - 1.0 mg/dL   Nitrite NEGATIVE  NEGATIVE   Leukocytes, UA NEGATIVE  NEGATIVE  POCT PREGNANCY, URINE     Status: None   Collection Time    11/07/13  9:37 AM      Result Value Ref Range   Preg Test, Ur NEGATIVE  NEGATIVE   No results found.  Assessment and Plan: 28 y.o. female with  1) abdominal pain. Suspicious for abdominal wall muscle strain. Pain started after patient started a core workout program. Pain worse with flexion of the abdominal wall muscles. Plan to treat with diclofenac. 2) vaginal discharge: Likely BV. Cytology pending. Empiric treatment with Flagyl.  Discussed warning signs or symptoms. Please see discharge instructions. Patient expresses understanding.    Rodolph BongEvan S Jeremey Bascom, MD 11/07/13 1031

## 2013-11-08 LAB — CERVICOVAGINAL ANCILLARY ONLY
Chlamydia: NEGATIVE
NEISSERIA GONORRHEA: NEGATIVE
WET PREP (BD AFFIRM): NEGATIVE
WET PREP (BD AFFIRM): POSITIVE — AB
Wet Prep (BD Affirm): NEGATIVE

## 2013-11-09 NOTE — ED Notes (Signed)
GC/Chlamydia neg., Affirm: Candida and Trich neg., Gardnerella pos. Pt. adequately treated with Flagyl. Desiree LucySuzanne M [facility] InstituteYork 11/09/2013

## 2018-10-01 ENCOUNTER — Emergency Department
Admission: EM | Admit: 2018-10-01 | Discharge: 2018-10-01 | Disposition: A | Discharge status: Home / Self Care | Attending: Emergency Medicine | Admitting: Emergency Medicine

## 2018-10-01 ENCOUNTER — Encounter: Payer: Self-pay | Admitting: *Deleted

## 2018-10-01 ENCOUNTER — Other Ambulatory Visit: Payer: Self-pay

## 2018-10-01 DIAGNOSIS — M779 Enthesopathy, unspecified: Secondary | ICD-10-CM | POA: Insufficient documentation

## 2018-10-01 DIAGNOSIS — M778 Other enthesopathies, not elsewhere classified: Secondary | ICD-10-CM
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
## 2018-10-01 NOTE — ED Triage Notes (Signed)
Pt to ED reporting right wrist pain that has worsened today. Pt denies injury. Only known event was that pt reports falling asleep on right arm Wednesday night and right hand went numb. NO discoloration. Pulse is intact.   Swelling noted to right wrist.

## 2018-10-01 NOTE — Discharge Instructions (Addendum)
Your exam seems consistent with thumb tendinitis, known as DeQuervain's Tenosynovitis. Use the thumb spica splint for support and protection. Apply ice and take OTC Ibuprofen or Naproxen. Follow-up with your provider for continued symptoms.

## 2018-10-01 NOTE — ED Notes (Signed)
See triage note Right wrist pain for several days  Denies any injury  Good pulses

## 2019-04-30 ENCOUNTER — Encounter: Payer: Self-pay | Admitting: Emergency Medicine

## 2019-04-30 ENCOUNTER — Emergency Department: Payer: Self-pay

## 2019-04-30 ENCOUNTER — Other Ambulatory Visit: Payer: Self-pay

## 2019-04-30 ENCOUNTER — Emergency Department
Admission: EM | Admit: 2019-04-30 | Discharge: 2019-04-30 | Disposition: A | Discharge status: Home / Self Care | Payer: Self-pay | Attending: Emergency Medicine | Admitting: Emergency Medicine

## 2019-04-30 DIAGNOSIS — R1011 Right upper quadrant pain: Secondary | ICD-10-CM | POA: Insufficient documentation

## 2019-04-30 LAB — CBC
HCT: 37.2 % (ref 36.0–46.0)
Hemoglobin: 12.2 g/dL (ref 12.0–15.0)
MCH: 27.2 pg (ref 26.0–34.0)
MCHC: 32.8 g/dL (ref 30.0–36.0)
MCV: 82.9 fL (ref 80.0–100.0)
Platelets: 212 10*3/uL (ref 150–400)
RBC: 4.49 MIL/uL (ref 3.87–5.11)
RDW: 13.6 % (ref 11.5–15.5)
WBC: 7.3 10*3/uL (ref 4.0–10.5)
nRBC: 0 % (ref 0.0–0.2)

## 2019-04-30 LAB — COMPREHENSIVE METABOLIC PANEL
ALT: 13 U/L (ref 0–44)
AST: 18 U/L (ref 15–41)
Albumin: 4.1 g/dL (ref 3.5–5.0)
Alkaline Phosphatase: 70 U/L (ref 38–126)
Anion gap: 9 (ref 5–15)
BUN: 11 mg/dL (ref 6–20)
CO2: 23 mmol/L (ref 22–32)
Calcium: 9.2 mg/dL (ref 8.9–10.3)
Chloride: 104 mmol/L (ref 98–111)
Creatinine, Ser: 0.64 mg/dL (ref 0.44–1.00)
GFR calc Af Amer: >60 mL/min (ref >60)
GFR calc non Af Amer: >60 mL/min (ref >60)
Glucose, Bld: 104 mg/dL — ABNORMAL HIGH (ref 70–99)
Potassium: 3.8 mmol/L (ref 3.5–5.1)
Sodium: 136 mmol/L (ref 135–145)
Total Bilirubin: 0.7 mg/dL (ref 0.3–1.2)
Total Protein: 8.8 g/dL — ABNORMAL HIGH (ref 6.5–8.1)

## 2019-04-30 LAB — URINALYSIS, COMPLETE (UACMP) WITH MICROSCOPIC
Bacteria, UA: NONE SEEN
Bilirubin Urine: NEGATIVE
Glucose, UA: NEGATIVE mg/dL
Hgb urine dipstick: NEGATIVE
Ketones, ur: NEGATIVE mg/dL
Leukocytes,Ua: NEGATIVE
Nitrite: NEGATIVE
Protein, ur: NEGATIVE mg/dL
Specific Gravity, Urine: 1.002 — ABNORMAL LOW (ref 1.005–1.030)
pH: 6 (ref 5.0–8.0)

## 2019-04-30 LAB — POCT PREGNANCY, URINE: Preg Test, Ur: NEGATIVE

## 2019-04-30 LAB — PREGNANCY, URINE: Preg Test, Ur: NEGATIVE

## 2019-04-30 LAB — LIPASE, BLOOD: Lipase: 13 U/L (ref 11–51)

## 2019-04-30 IMAGING — US US ABDOMEN LIMITED
1 series · 14 of 25 positions shown · non-contrast
Comparison: CT abdomen and pelvis and abdominal ultrasound
[DATE].

CLINICAL DATA: Right upper quadrant pain for 1 day.

EXAM:
ULTRASOUND ABDOMEN LIMITED RIGHT UPPER QUADRANT

[Series 1: us abdomen limited · 14 of 41 slices shown]
[im 1/41]
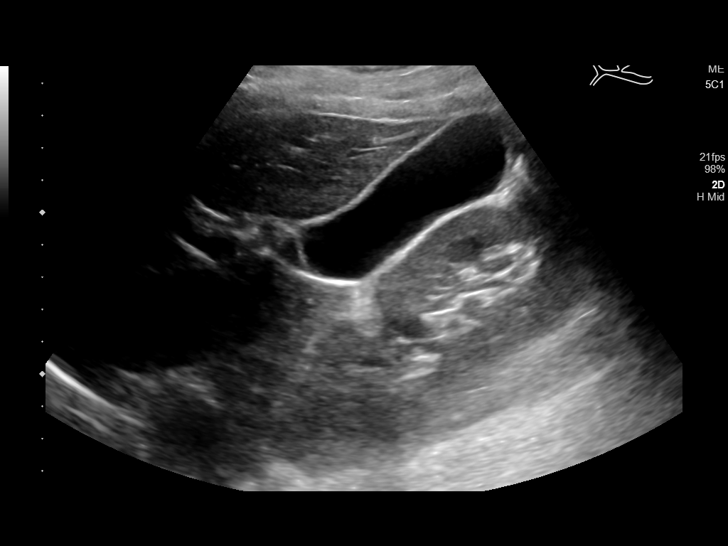
[im 4/41]
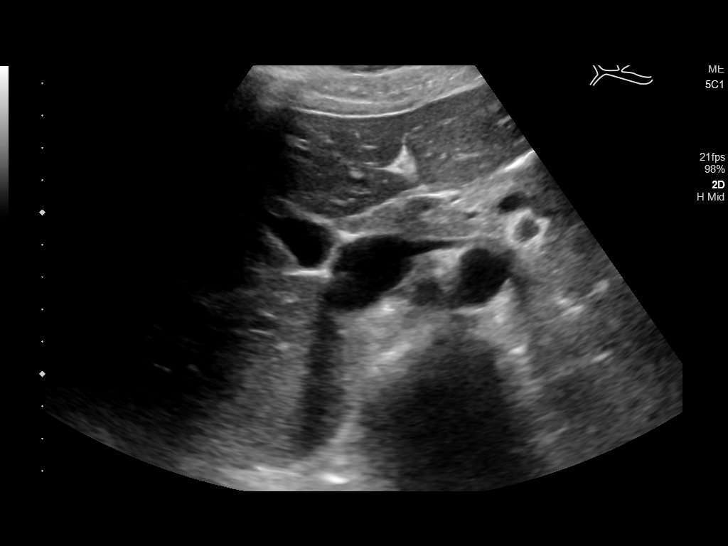
[im 7/41]
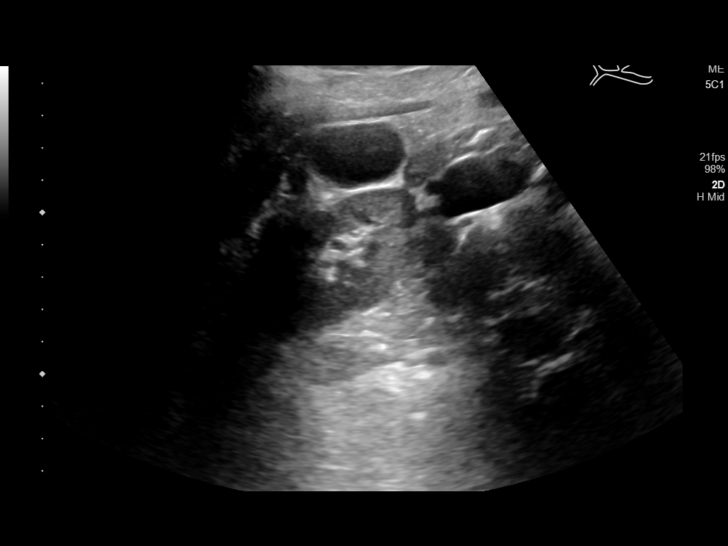
[im 11/41]
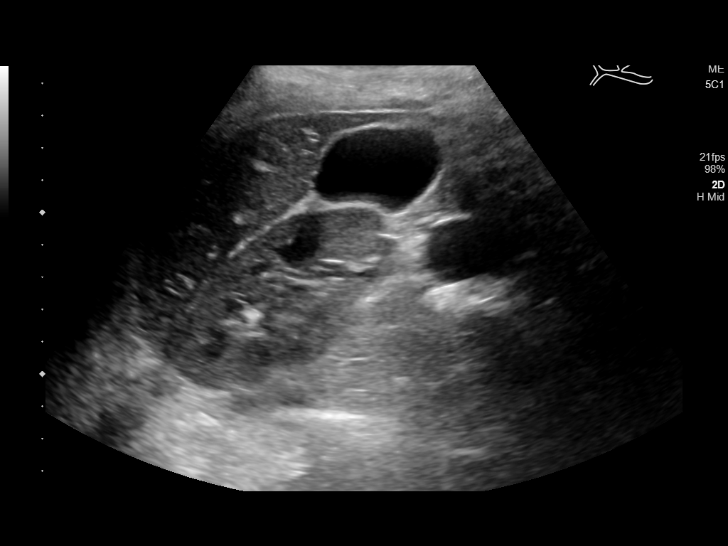
[im 14/41]
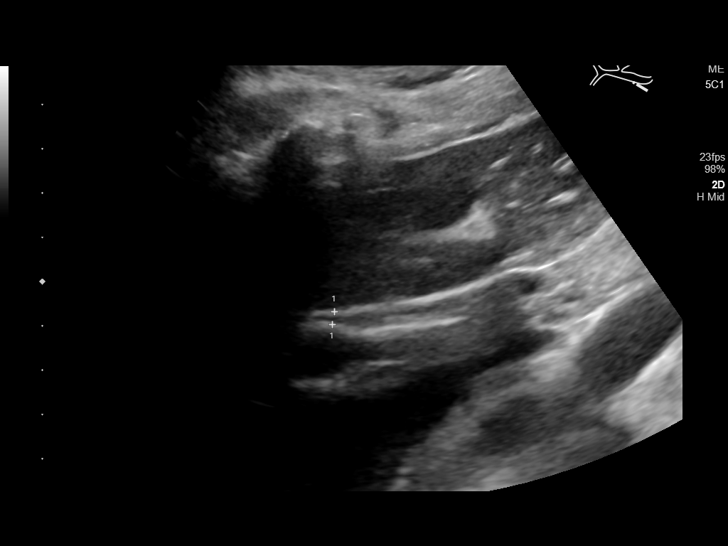
[im 16/41]
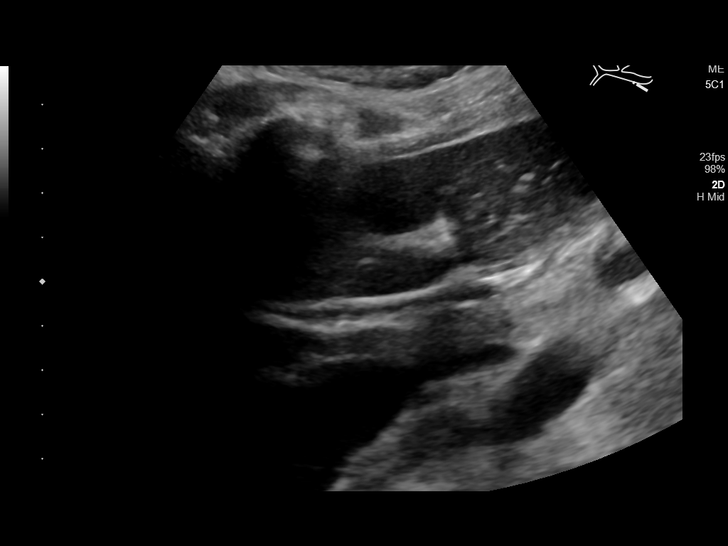
[im 19/41]
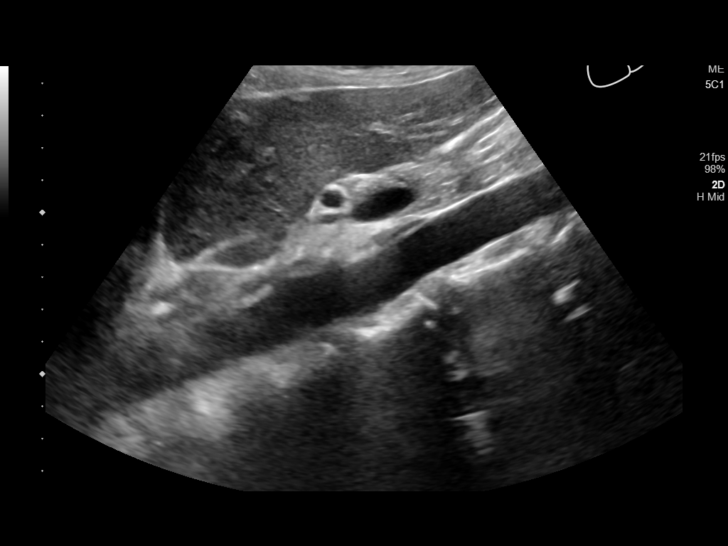
[im 22/41]
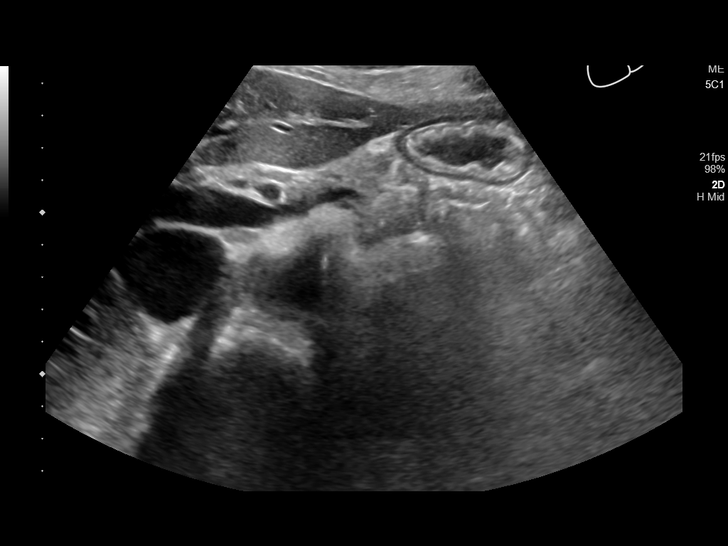
[im 26/41]
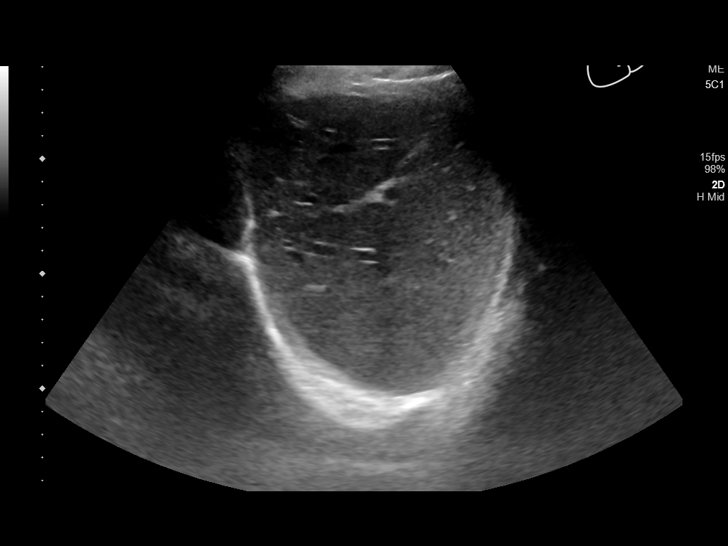
[im 27/41]
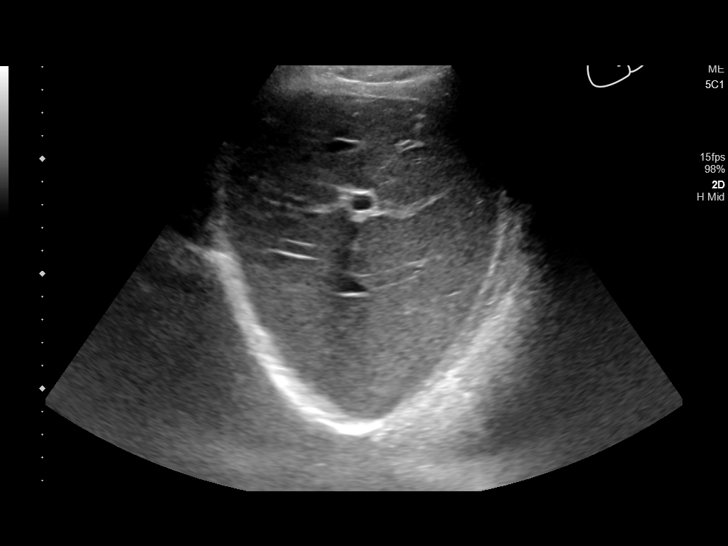
[im 31/41]
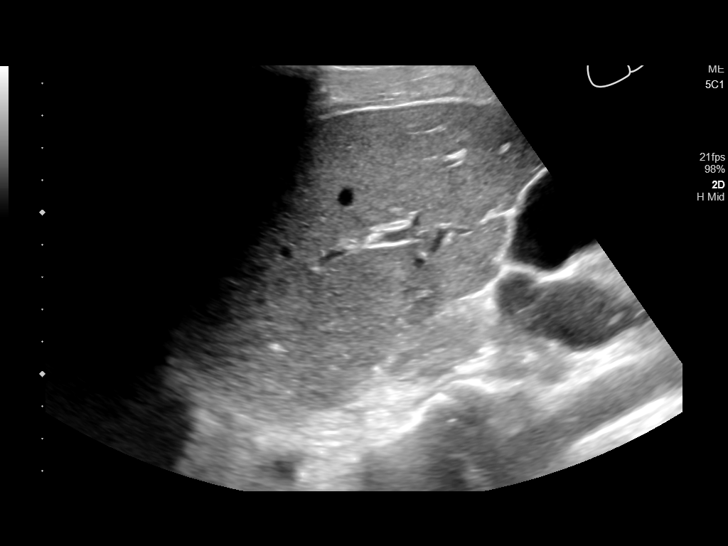
[im 34/41]
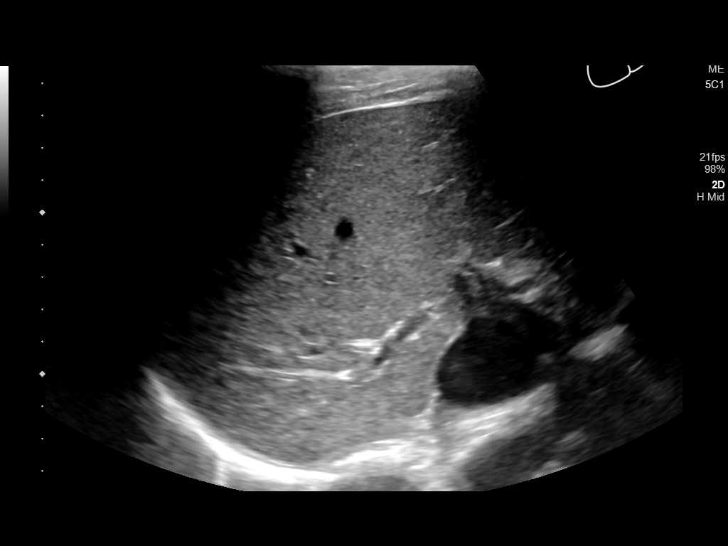
[im 37/41]
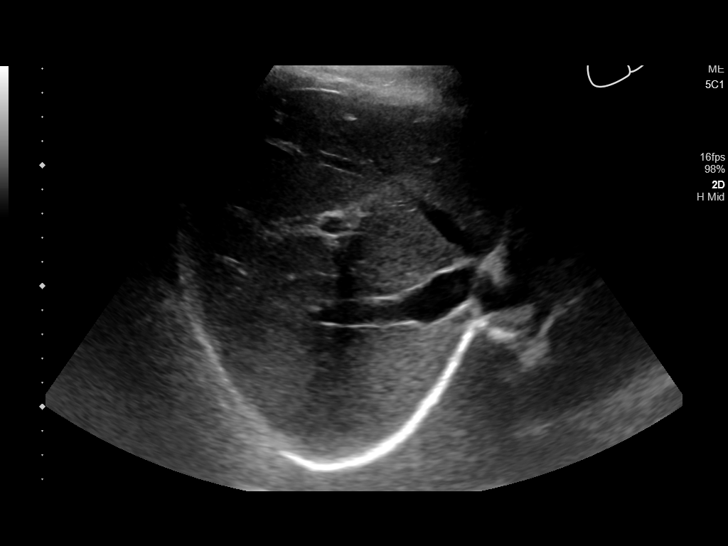
[im 41/41]
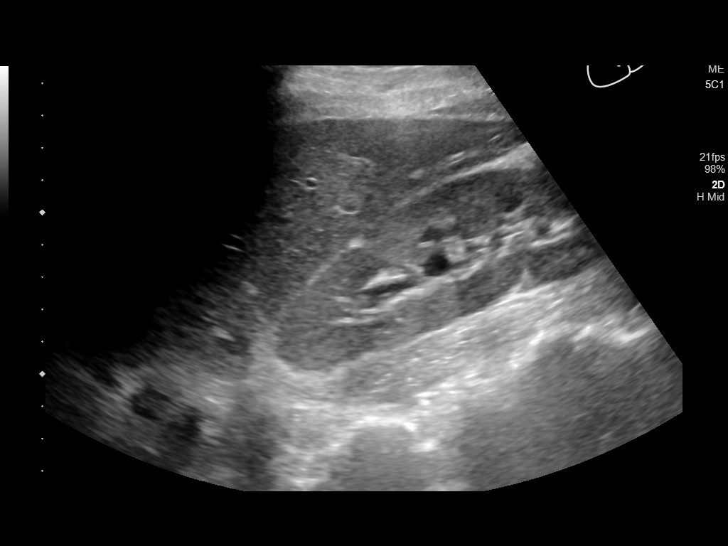

[14 of 25 positions shown; findings below may reference images not displayed]

FINDINGS: Gallbladder:

No gallstones or wall thickening visualized. No sonographic Murphy
sign noted by sonographer.

Common bile duct:

Diameter: 0.3 cm.

Liver:

No focal lesion identified. Within normal limits in parenchymal
echogenicity. Portal vein is patent on color Doppler imaging with
normal direction of blood flow towards the liver.

Other: None.
IMPRESSION: Negative for gallstones.  Negative exam.

## 2019-04-30 MED ORDER — HYOSCYAMINE SULFATE 0.125 MG SL SUBL: 0.1250 mg | SUBLINGUAL_TABLET | SUBLINGUAL | 0 refills | Status: AC | PRN

## 2019-04-30 MED ORDER — MORPHINE SULFATE (PF) 4 MG/ML IV SOLN
4.0000 mg | Freq: Once | INTRAVENOUS | Status: AC
  Administered 2019-04-30: 4 mg via INTRAVENOUS
  Filled 2019-04-30: qty 1

## 2019-04-30 MED ORDER — FENTANYL CITRATE (PF) 100 MCG/2ML IJ SOLN
50.0000 ug | INTRAMUSCULAR | Status: DC | PRN
  Administered 2019-04-30: 50 ug via INTRAVENOUS
  Filled 2019-04-30: qty 2

## 2019-04-30 MED ORDER — HYOSCYAMINE SULFATE 0.125 MG PO TBDP
0.2500 mg | ORAL_TABLET | Freq: Once | ORAL | Status: AC
  Administered 2019-04-30: 16:00:00 0.25 mg via ORAL
  Filled 2019-04-30: qty 2

## 2019-04-30 MED ORDER — IOHEXOL 300 MG/ML  SOLN
100.0000 mL | Freq: Once | INTRAMUSCULAR | Status: AC | PRN
  Administered 2019-04-30: 100 mL via INTRAVENOUS

## 2019-04-30 MED ORDER — ONDANSETRON HCL 4 MG/2ML IJ SOLN
4.0000 mg | Freq: Once | INTRAMUSCULAR | Status: AC | PRN
  Administered 2019-04-30: 4 mg via INTRAVENOUS
  Filled 2019-04-30: qty 2

## 2019-04-30 MED ORDER — ONDANSETRON HCL 4 MG/2ML IJ SOLN
4.0000 mg | Freq: Once | INTRAMUSCULAR | Status: AC
  Administered 2019-04-30: 4 mg via INTRAVENOUS
  Filled 2019-04-30: qty 2

## 2019-04-30 NOTE — ED Triage Notes (Signed)
Pt comes in tearful from RUQ pain.  Started last night after eating spicy jamaican style meal.  No vomiting. Appears in severe pain.  No vomiting or diarrhea.  Tachy in triage. No fevers.

## 2019-04-30 NOTE — ED Notes (Signed)
Patient transported to CT 

## 2019-04-30 NOTE — Discharge Instructions (Signed)
Please follow-up with the gastroenterologist if not improving over the next few days.  Take the medication as prescribed.  If your symptoms change or worsen, return to the emergency department.
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
## 2019-04-30 NOTE — ED Notes (Signed)
Peripheral IV discontinued. Catheter intact. No signs of infiltration or redness. Gauze applied to IV site.   Discharge instructions reviewed with patient. Questions fielded by this RN. Patient verbalizes understanding of instructions. Patient discharged home in stable condition per Vladimir Crofts, NP. No acute distress noted at time of discharge.

## 2019-05-02 ENCOUNTER — Telehealth: Payer: Self-pay | Admitting: Gastroenterology

## 2019-05-02 NOTE — Telephone Encounter (Signed)
Mailbox full to offer Ed f/u apt with Dr. Marius Ditch

## 2019-05-13 ENCOUNTER — Encounter: Payer: Self-pay | Admitting: Gastroenterology
